# Patient Record
Sex: Female | Born: 1960 | Race: White | Hispanic: No | Smoking: Never smoker
Health system: Southern US, Community
[De-identification: ages and names within clinical notes are randomized; demographics above are authoritative.]

## PROBLEM LIST (undated history)

## (undated) DIAGNOSIS — E039 Hypothyroidism, unspecified: Secondary | ICD-10-CM

## (undated) DIAGNOSIS — E063 Autoimmune thyroiditis: Secondary | ICD-10-CM

---

## 2015-10-10 ENCOUNTER — Other Ambulatory Visit: Payer: Self-pay | Admitting: Pain Medicine

## 2015-10-10 DIAGNOSIS — R52 Pain, unspecified: Secondary | ICD-10-CM

## 2015-10-28 ENCOUNTER — Ambulatory Visit
Admission: RE | Admit: 2015-10-28 | Discharge: 2015-10-28 | Disposition: A | Discharge status: Home / Self Care | Payer: BLUE CROSS/BLUE SHIELD | Source: Ambulatory Visit | Attending: Pain Medicine | Admitting: Pain Medicine

## 2015-10-28 DIAGNOSIS — R52 Pain, unspecified: Secondary | ICD-10-CM

## 2018-12-15 ENCOUNTER — Other Ambulatory Visit: Payer: Self-pay | Admitting: Internal Medicine

## 2018-12-15 DIAGNOSIS — N6489 Other specified disorders of breast: Secondary | ICD-10-CM

## 2018-12-23 ENCOUNTER — Ambulatory Visit
Admission: RE | Admit: 2018-12-23 | Discharge: 2018-12-23 | Disposition: A | Discharge status: Home / Self Care | Payer: No Typology Code available for payment source | Source: Ambulatory Visit | Attending: Internal Medicine | Admitting: Internal Medicine

## 2018-12-23 DIAGNOSIS — N6489 Other specified disorders of breast: Secondary | ICD-10-CM

## 2019-05-23 ENCOUNTER — Other Ambulatory Visit: Payer: Self-pay | Admitting: Internal Medicine

## 2019-05-23 DIAGNOSIS — R928 Other abnormal and inconclusive findings on diagnostic imaging of breast: Secondary | ICD-10-CM

## 2019-06-06 ENCOUNTER — Other Ambulatory Visit: Payer: Self-pay

## 2019-06-06 ENCOUNTER — Ambulatory Visit
Admission: RE | Admit: 2019-06-06 | Discharge: 2019-06-06 | Disposition: A | Discharge status: Home / Self Care | Payer: No Typology Code available for payment source | Source: Ambulatory Visit | Attending: Internal Medicine | Admitting: Internal Medicine

## 2019-06-06 ENCOUNTER — Ambulatory Visit: Admission: RE | Admit: 2019-06-06 | Payer: No Typology Code available for payment source | Source: Ambulatory Visit

## 2019-06-06 DIAGNOSIS — R928 Other abnormal and inconclusive findings on diagnostic imaging of breast: Secondary | ICD-10-CM

## 2020-01-04 ENCOUNTER — Other Ambulatory Visit: Payer: Self-pay | Admitting: Family Medicine

## 2020-01-04 DIAGNOSIS — Z1231 Encounter for screening mammogram for malignant neoplasm of breast: Secondary | ICD-10-CM

## 2020-03-01 ENCOUNTER — Ambulatory Visit
Admission: RE | Admit: 2020-03-01 | Discharge: 2020-03-01 | Disposition: A | Discharge status: Home / Self Care | Payer: No Typology Code available for payment source | Source: Ambulatory Visit | Attending: Family Medicine | Admitting: Family Medicine

## 2020-03-01 ENCOUNTER — Other Ambulatory Visit: Payer: Self-pay

## 2020-03-01 DIAGNOSIS — Z1231 Encounter for screening mammogram for malignant neoplasm of breast: Secondary | ICD-10-CM

## 2021-01-29 ENCOUNTER — Other Ambulatory Visit: Payer: Self-pay | Admitting: Family Medicine

## 2021-01-29 DIAGNOSIS — Z1231 Encounter for screening mammogram for malignant neoplasm of breast: Secondary | ICD-10-CM

## 2021-03-21 ENCOUNTER — Ambulatory Visit
Admission: RE | Admit: 2021-03-21 | Discharge: 2021-03-21 | Disposition: A | Discharge status: Home / Self Care | Payer: No Typology Code available for payment source | Source: Ambulatory Visit | Attending: Family Medicine | Admitting: Family Medicine

## 2021-03-21 ENCOUNTER — Other Ambulatory Visit: Payer: Self-pay

## 2021-03-21 DIAGNOSIS — Z1231 Encounter for screening mammogram for malignant neoplasm of breast: Secondary | ICD-10-CM

## 2021-03-25 ENCOUNTER — Other Ambulatory Visit: Payer: Self-pay | Admitting: Family Medicine

## 2021-03-25 DIAGNOSIS — R928 Other abnormal and inconclusive findings on diagnostic imaging of breast: Secondary | ICD-10-CM

## 2021-03-28 ENCOUNTER — Other Ambulatory Visit: Payer: Self-pay

## 2021-03-28 ENCOUNTER — Ambulatory Visit
Admission: RE | Admit: 2021-03-28 | Discharge: 2021-03-28 | Disposition: A | Discharge status: Home / Self Care | Payer: No Typology Code available for payment source | Source: Ambulatory Visit | Attending: Family Medicine | Admitting: Family Medicine

## 2021-03-28 DIAGNOSIS — R928 Other abnormal and inconclusive findings on diagnostic imaging of breast: Secondary | ICD-10-CM

## 2021-04-21 ENCOUNTER — Ambulatory Visit: Payer: Self-pay | Admitting: Surgery

## 2021-04-21 DIAGNOSIS — N6310 Unspecified lump in the right breast, unspecified quadrant: Secondary | ICD-10-CM

## 2021-04-21 NOTE — H&P (Signed)
History of Present Illness Sarah Greer. Tregan Read MD; 04/21/2021 11:14 AM) The patient is a 60 year old female who presents with a breast mass. Referred by Dr. Modena Nunnery for right breast mass PCP Dr. Arlyss Repress  This is a 60 year old female who is status post a right breast biopsy in 2020 for an area of distortion in the upper outer quadrant. The biopsy at that time was benign. She had a mammogram earlier this year that showed a more pronounced and focal area of distortion. Radiology discussed with the patient and recommended either biopsy or excision. The patient has opted for excision. She presents now to discuss surgery.  No family history of breast cancer. No previous breast surgery.    Problem List/Past Medical Rodman Key K. Cleveland Paiz, MD; 04/21/2021 11:15 AM) MASS OF RIGHT BREAST ON MAMMOGRAM (N63.10)  Past Surgical History Mammie Lorenzo, LPN; 01/26/2541 70:62 AM) Breast Biopsy Right. Colon Polyp Removal - Colonoscopy  Diagnostic Studies History Mammie Lorenzo, LPN; 3/76/2831 51:76 AM) Colonoscopy 1-5 years ago Mammogram within last year Pap Smear 1-5 years ago  Allergies Mammie Lorenzo, LPN; 1/60/7371 06:26 AM) Codeine/Codeine Derivatives HYDROcodone-Acetaminophen *ANALGESICS - OPIOID* Allergies Reconciled  Medication History Mammie Lorenzo, LPN; 9/48/5462 70:35 AM) Tirosint (88MCG Capsule, Oral) Active. Vitamin B Complex (Oral) Active. Vitamin C (500MG  Capsule, Oral) Active. Medications Reconciled  Social History Mammie Lorenzo, LPN; 0/08/3817 29:93 AM) Alcohol use Occasional alcohol use. Caffeine use Coffee. No drug use Tobacco use Never smoker.  Family History Mammie Lorenzo, LPN; 06/21/9677 93:81 AM) Cervical Cancer Mother. Depression Daughter, Father, Son. Heart Disease Mother. Hypertension Father, Mother, Sister. Thyroid problems Mother.  Pregnancy / Birth History Mammie Lorenzo, LPN; 0/17/5102 58:52 AM) Age at menarche 32 years. Age  of menopause 54-50 Gravida 3 Length (months) of breastfeeding 7-12 Maternal age 59-30 Para 3  Other Problems Sarah Greer. Kniyah Khun, MD; 04/21/2021 11:15 AM) Depression Thyroid Disease     Review of Systems [name] LPN; 7/78/2423 53:61 AM) General Not Present- Appetite Loss, Chills, Fatigue, Fever, Night Sweats, Weight Gain and Weight Loss. Skin Not Present- Change in Wart/Mole, Dryness, Hives, Jaundice, New Lesions, Non-Healing Wounds, Rash and Ulcer. HEENT Present- Wears glasses/contact lenses. Not Present- Earache, Hearing Loss, Hoarseness, Nose Bleed, Oral Ulcers, Ringing in the Ears, Seasonal Allergies, Sinus Pain, Sore Throat, Visual Disturbances and Yellow Eyes. Respiratory Not Present- Bloody sputum, Chronic Cough, Difficulty Breathing, Snoring and Wheezing. Breast Present- Breast Pain. Not Present- Breast Mass, Nipple Discharge and Skin Changes. Cardiovascular Not Present- Chest Pain, Difficulty Breathing Lying Down, Leg Cramps, Palpitations, Rapid Heart Rate, Shortness of Breath and Swelling of Extremities. Gastrointestinal Not Present- Abdominal Pain, Bloating, Bloody Stool, Change in Bowel Habits, Chronic diarrhea, Constipation, Difficulty Swallowing, Excessive gas, Gets full quickly at meals, Hemorrhoids, Indigestion, Nausea, Rectal Pain and Vomiting. Female Genitourinary Not Present- Frequency, Nocturia, Painful Urination, Pelvic Pain and Urgency. Musculoskeletal Not Present- Back Pain, Joint Pain, Joint Stiffness, Muscle Pain, Muscle Weakness and Swelling of Extremities. Neurological Not Present- Decreased Memory, Fainting, Headaches, Numbness, Seizures, Tingling, Tremor, Trouble walking and Weakness. Psychiatric Not Present- Anxiety, Bipolar, Change in Sleep Pattern, Depression, Fearful and Frequent crying. Endocrine Not Present- Cold Intolerance, Excessive Hunger, Hair Changes, Heat Intolerance, Hot flashes and New Diabetes. Hematology Not Present- Blood Thinners,  Easy Bruising, Excessive bleeding, Gland problems, HIV and Persistent Infections.  Vitals Claiborne Billings Dockery LPN; 4/43/1540 08:67 AM) 04/21/2021 10:50 AM Weight: 173.4 lb Height: 66in Body Surface Area: 1.88 m Body Mass Index: 27.99 kg/m  Pulse: 74 (Regular)  BP: 120/74(Sitting, Left Arm, Standard)  Physical Exam Rodman Key K. Jojo Pehl MD; 04/21/2021 11:15 AM)  The physical exam findings are as follows: Note:Constitutional: WDWN in NAD, conversant, no obvious deformities; resting comfortably Eyes: Pupils equal, round; sclera anicteric; moist conjunctiva; no lid lag HENT: Oral mucosa moist; good dentition Neck: No masses palpated, trachea midline; no thyromegaly Lungs: CTA bilaterally; normal respiratory effort Breasts: Symmetric, no nipple changes, no nipple discharge, bilateral fibrocystic changes, no axillary lymphadenopathy, no dominant masses on either side. CV: Regular rate and rhythm; no murmurs; extremities well-perfused with no edema Abd: +bowel sounds, soft, non-tender, no palpable organomegaly; no palpable hernias Musc: Normal gait; no apparent clubbing or cyanosis in extremities Lymphatic: No palpable cervical or axillary lymphadenopathy Skin: Warm, dry; no sign of jaundice Psychiatric - alert and oriented x 4; calm mood and affect    Assessment & Plan Rodman Key K. Arantxa Piercey MD; 04/21/2021 11:12 AM)  MASS OF RIGHT BREAST ON MAMMOGRAM (N63.10)  Current Plans Schedule for Surgery - Right radioactive seed localized lumpectomy. The surgical procedure has been discussed with the patient. Potential risks, benefits, alternative treatments, and expected outcomes have been explained. All of the patient's questions at this time have been answered. The likelihood of reaching the patient's treatment goal is good. The patient understand the proposed surgical procedure and wishes to proceed.  Sarah Greer. Georgette Dover, MD, [facility]  General/ Trauma  Surgery   04/21/2021 11:16 AM

## 2021-04-21 NOTE — H&P (View-Only) (Signed)
History of Present Illness Sarah Greer. Sarah Radu MD; 04/21/2021 11:14 AM) The patient is a 60 year old female who presents with a breast mass. Referred by Dr. Modena Nunnery for right breast mass PCP Dr. Arlyss Greer  This is a 60 year old female who is status post a right breast biopsy in 2020 for an area of distortion in the upper outer quadrant. The biopsy at that time was benign. She had a mammogram earlier this year that showed a more pronounced and focal area of distortion. Radiology discussed with the patient and recommended either biopsy or excision. The patient has opted for excision. She presents now to discuss surgery.  No family history of breast cancer. No previous breast surgery.    Problem List/Past Medical Sarah Greer K. Cem Kosman, MD; 04/21/2021 11:15 AM) MASS OF RIGHT BREAST ON MAMMOGRAM (N63.10)  Past Surgical History Sarah Greer; 01/26/2541 70:62 AM) Breast Biopsy Right. Colon Polyp Removal - Colonoscopy  Diagnostic Studies History Sarah Greer; 3/76/2831 51:76 AM) Colonoscopy 1-5 years ago Mammogram within last year Pap Smear 1-5 years ago  Allergies Sarah Greer; 1/60/7371 06:26 AM) Codeine/Codeine Derivatives HYDROcodone-Acetaminophen *ANALGESICS - OPIOID* Allergies Reconciled  Medication History Sarah Greer; 9/48/5462 70:35 AM) Tirosint (88MCG Capsule, Oral) Active. Vitamin B Complex (Oral) Active. Vitamin C (500MG  Capsule, Oral) Active. Medications Reconciled  Social History Sarah Greer; 0/08/3817 29:93 AM) Alcohol use Occasional alcohol use. Caffeine use Coffee. No drug use Tobacco use Never smoker.  Family History Sarah Greer; 06/21/9677 93:81 AM) Cervical Cancer Mother. Depression Daughter, Father, Son. Heart Disease Mother. Hypertension Father, Mother, Sister. Thyroid problems Mother.  Pregnancy / Birth History Sarah Greer; 0/17/5102 58:52 AM) Age at menarche 32 years. Age  of menopause 54-50 Gravida 3 Length (months) of breastfeeding 7-12 Maternal age 59-30 Para 3  Other Problems Sarah Greer. Sarah Lezotte, MD; 04/21/2021 11:15 AM) Depression Thyroid Disease     Review of Systems Sarah Greer; 7/78/2423 53:61 AM) General Not Present- Appetite Loss, Chills, Fatigue, Fever, Night Sweats, Weight Gain and Weight Loss. Skin Not Present- Change in Wart/Mole, Dryness, Hives, Jaundice, New Lesions, Non-Healing Wounds, Rash and Ulcer. HEENT Present- Wears glasses/contact lenses. Not Present- Earache, Hearing Loss, Hoarseness, Nose Bleed, Oral Ulcers, Ringing in the Ears, Seasonal Allergies, Sinus Pain, Sore Throat, Visual Disturbances and Yellow Eyes. Respiratory Not Present- Bloody sputum, Chronic Cough, Difficulty Breathing, Snoring and Wheezing. Breast Present- Breast Pain. Not Present- Breast Mass, Nipple Discharge and Skin Changes. Cardiovascular Not Present- Chest Pain, Difficulty Breathing Lying Down, Leg Cramps, Palpitations, Rapid Heart Rate, Shortness of Breath and Swelling of Extremities. Gastrointestinal Not Present- Abdominal Pain, Bloating, Bloody Stool, Change in Bowel Habits, Chronic diarrhea, Constipation, Difficulty Swallowing, Excessive gas, Gets full quickly at meals, Hemorrhoids, Indigestion, Nausea, Rectal Pain and Vomiting. Female Genitourinary Not Present- Frequency, Nocturia, Painful Urination, Pelvic Pain and Urgency. Musculoskeletal Not Present- Back Pain, Joint Pain, Joint Stiffness, Muscle Pain, Muscle Weakness and Swelling of Extremities. Neurological Not Present- Decreased Memory, Fainting, Headaches, Numbness, Seizures, Tingling, Tremor, Trouble walking and Weakness. Psychiatric Not Present- Anxiety, Bipolar, Change in Sleep Pattern, Depression, Fearful and Frequent crying. Endocrine Not Present- Cold Intolerance, Excessive Hunger, Hair Changes, Heat Intolerance, Hot flashes and New Diabetes. Hematology Not Present- Blood Thinners,  Easy Bruising, Excessive bleeding, Gland problems, HIV and Persistent Infections.  Vitals Sarah Billings Dockery Greer; 4/43/1540 08:67 AM) 04/21/2021 10:50 AM Weight: 173.4 lb Height: 66in Body Surface Area: 1.88 m Body Mass Index: 27.99 kg/m  Pulse: 74 (Regular)  BP: 120/74(Sitting, Left Arm, Standard)  Physical Exam Sarah Greer K. Mathews Stuhr MD; 04/21/2021 11:15 AM)  The physical exam findings are as follows: Note:Constitutional: WDWN in NAD, conversant, no obvious deformities; resting comfortably Eyes: Pupils equal, round; sclera anicteric; moist conjunctiva; no lid lag HENT: Oral mucosa moist; good dentition Neck: No masses palpated, trachea midline; no thyromegaly Lungs: CTA bilaterally; normal respiratory effort Breasts: Symmetric, no nipple changes, no nipple discharge, bilateral fibrocystic changes, no axillary lymphadenopathy, no dominant masses on either side. CV: Regular rate and rhythm; no murmurs; extremities well-perfused with no edema Abd: +bowel sounds, soft, non-tender, no palpable organomegaly; no palpable hernias Musc: Normal gait; no apparent clubbing or cyanosis in extremities Lymphatic: No palpable cervical or axillary lymphadenopathy Skin: Warm, dry; no sign of jaundice Psychiatric - alert and oriented x 4; calm mood and affect    Assessment & Plan Sarah Greer K. Cecil Vandyke MD; 04/21/2021 11:12 AM)  MASS OF RIGHT BREAST ON MAMMOGRAM (N63.10)  Current Plans Schedule for Surgery - Right radioactive seed localized lumpectomy. The surgical procedure has been discussed with the patient. Potential risks, benefits, alternative treatments, and expected outcomes have been explained. All of the patient's questions at this time have been answered. The likelihood of reaching the patient's treatment goal is good. The patient understand the proposed surgical procedure and wishes to proceed.  Sarah Greer. Sarah Dover, MD, [facility]  General/ Trauma  Surgery   04/21/2021 11:16 AM

## 2021-04-22 ENCOUNTER — Other Ambulatory Visit: Payer: Self-pay | Admitting: Surgery

## 2021-04-22 DIAGNOSIS — N6310 Unspecified lump in the right breast, unspecified quadrant: Secondary | ICD-10-CM

## 2021-04-28 ENCOUNTER — Other Ambulatory Visit: Payer: Self-pay

## 2021-04-28 ENCOUNTER — Encounter (HOSPITAL_BASED_OUTPATIENT_CLINIC_OR_DEPARTMENT_OTHER): Payer: Self-pay | Admitting: Surgery

## 2021-04-30 NOTE — Progress Notes (Signed)

## 2021-05-06 ENCOUNTER — Ambulatory Visit
Admission: RE | Admit: 2021-05-06 | Discharge: 2021-05-06 | Disposition: A | Discharge status: Home / Self Care | Payer: No Typology Code available for payment source | Source: Ambulatory Visit | Attending: Surgery | Admitting: Surgery

## 2021-05-06 ENCOUNTER — Other Ambulatory Visit: Payer: Self-pay

## 2021-05-06 DIAGNOSIS — N6310 Unspecified lump in the right breast, unspecified quadrant: Secondary | ICD-10-CM

## 2021-05-08 ENCOUNTER — Encounter (HOSPITAL_BASED_OUTPATIENT_CLINIC_OR_DEPARTMENT_OTHER)
Admission: RE | Disposition: A | Discharge status: Home / Self Care | Payer: Self-pay | Source: Home / Self Care | Attending: Surgery

## 2021-05-08 ENCOUNTER — Other Ambulatory Visit: Payer: Self-pay

## 2021-05-08 ENCOUNTER — Ambulatory Visit
Admission: RE | Admit: 2021-05-08 | Discharge: 2021-05-08 | Disposition: A | Discharge status: Home / Self Care | Payer: No Typology Code available for payment source | Source: Ambulatory Visit | Attending: Surgery | Admitting: Surgery

## 2021-05-08 ENCOUNTER — Encounter (HOSPITAL_BASED_OUTPATIENT_CLINIC_OR_DEPARTMENT_OTHER): Payer: Self-pay | Admitting: Surgery

## 2021-05-08 ENCOUNTER — Ambulatory Visit (HOSPITAL_BASED_OUTPATIENT_CLINIC_OR_DEPARTMENT_OTHER)
Admission: RE | Admit: 2021-05-08 | Discharge: 2021-05-08 | Disposition: A | Discharge status: Home / Self Care | Payer: No Typology Code available for payment source | Attending: Surgery | Admitting: Surgery

## 2021-05-08 ENCOUNTER — Ambulatory Visit (HOSPITAL_BASED_OUTPATIENT_CLINIC_OR_DEPARTMENT_OTHER): Payer: No Typology Code available for payment source | Admitting: Anesthesiology

## 2021-05-08 DIAGNOSIS — Z8249 Family history of ischemic heart disease and other diseases of the circulatory system: Secondary | ICD-10-CM | POA: Insufficient documentation

## 2021-05-08 DIAGNOSIS — R928 Other abnormal and inconclusive findings on diagnostic imaging of breast: Secondary | ICD-10-CM | POA: Insufficient documentation

## 2021-05-08 DIAGNOSIS — Z86010 Personal history of colonic polyps: Secondary | ICD-10-CM | POA: Insufficient documentation

## 2021-05-08 DIAGNOSIS — N6021 Fibroadenosis of right breast: Secondary | ICD-10-CM | POA: Insufficient documentation

## 2021-05-08 DIAGNOSIS — Z8049 Family history of malignant neoplasm of other genital organs: Secondary | ICD-10-CM | POA: Insufficient documentation

## 2021-05-08 DIAGNOSIS — Z885 Allergy status to narcotic agent status: Secondary | ICD-10-CM | POA: Insufficient documentation

## 2021-05-08 DIAGNOSIS — D241 Benign neoplasm of right breast: Secondary | ICD-10-CM | POA: Insufficient documentation

## 2021-05-08 DIAGNOSIS — N6310 Unspecified lump in the right breast, unspecified quadrant: Secondary | ICD-10-CM

## 2021-05-08 HISTORY — DX: Hypothyroidism, unspecified: E03.9

## 2021-05-08 HISTORY — DX: Autoimmune thyroiditis: E06.3

## 2021-05-08 HISTORY — PX: BREAST LUMPECTOMY WITH RADIOACTIVE SEED LOCALIZATION: SHX6424

## 2021-05-08 SURGERY — BREAST LUMPECTOMY WITH RADIOACTIVE SEED LOCALIZATION
Anesthesia: General | Site: Breast | Laterality: Right

## 2021-05-08 MED ORDER — DEXAMETHASONE SODIUM PHOSPHATE 10 MG/ML IJ SOLN
INTRAMUSCULAR | Status: AC
  Filled 2021-05-08: qty 1

## 2021-05-08 MED ORDER — PROPOFOL 10 MG/ML IV BOLUS
INTRAVENOUS | Status: AC
  Filled 2021-05-08: qty 20

## 2021-05-08 MED ORDER — MIDAZOLAM HCL 5 MG/5ML IJ SOLN
INTRAMUSCULAR | Status: DC | PRN
  Administered 2021-05-08: 2 mg via INTRAVENOUS

## 2021-05-08 MED ORDER — FENTANYL CITRATE (PF) 100 MCG/2ML IJ SOLN
INTRAMUSCULAR | Status: DC | PRN
  Administered 2021-05-08 (×2): 50 ug via INTRAVENOUS

## 2021-05-08 MED ORDER — CEFAZOLIN SODIUM-DEXTROSE 2-4 GM/100ML-% IV SOLN
2.0000 g | INTRAVENOUS | Status: AC
  Administered 2021-05-08: 2 g via INTRAVENOUS

## 2021-05-08 MED ORDER — EPHEDRINE SULFATE 50 MG/ML IJ SOLN
INTRAMUSCULAR | Status: DC | PRN
  Administered 2021-05-08 (×2): 10 mg via INTRAVENOUS

## 2021-05-08 MED ORDER — BUPIVACAINE-EPINEPHRINE 0.25% -1:200000 IJ SOLN
INTRAMUSCULAR | Status: DC | PRN
  Administered 2021-05-08: 10 mL

## 2021-05-08 MED ORDER — MIDAZOLAM HCL 2 MG/2ML IJ SOLN
INTRAMUSCULAR | Status: AC
  Filled 2021-05-08: qty 2

## 2021-05-08 MED ORDER — OXYCODONE HCL 5 MG PO TABS: 5.0000 mg | ORAL_TABLET | Freq: Once | ORAL | Status: DC | PRN

## 2021-05-08 MED ORDER — LACTATED RINGERS IV SOLN: INTRAVENOUS | Status: DC

## 2021-05-08 MED ORDER — MEPERIDINE HCL 25 MG/ML IJ SOLN: 6.2500 mg | INTRAMUSCULAR | Status: DC | PRN

## 2021-05-08 MED ORDER — PROPOFOL 10 MG/ML IV BOLUS
INTRAVENOUS | Status: DC | PRN
  Administered 2021-05-08: 200 mg via INTRAVENOUS

## 2021-05-08 MED ORDER — PROMETHAZINE HCL 25 MG/ML IJ SOLN
6.2500 mg | INTRAMUSCULAR | Status: DC | PRN
Start: 2021-05-08 — End: 2021-05-08

## 2021-05-08 MED ORDER — ACETAMINOPHEN 500 MG PO TABS
1000.0000 mg | ORAL_TABLET | ORAL | Status: AC
  Administered 2021-05-08: 1000 mg via ORAL

## 2021-05-08 MED ORDER — HYDROMORPHONE HCL 1 MG/ML IJ SOLN: 0.2500 mg | INTRAMUSCULAR | Status: DC | PRN

## 2021-05-08 MED ORDER — FENTANYL CITRATE (PF) 100 MCG/2ML IJ SOLN
INTRAMUSCULAR | Status: AC
  Filled 2021-05-08: qty 2

## 2021-05-08 MED ORDER — AMISULPRIDE (ANTIEMETIC) 5 MG/2ML IV SOLN: 10.0000 mg | Freq: Once | INTRAVENOUS | Status: DC | PRN

## 2021-05-08 MED ORDER — CEFAZOLIN SODIUM-DEXTROSE 2-4 GM/100ML-% IV SOLN
INTRAVENOUS | Status: AC
  Filled 2021-05-08: qty 100

## 2021-05-08 MED ORDER — LIDOCAINE HCL (CARDIAC) PF 100 MG/5ML IV SOSY
PREFILLED_SYRINGE | INTRAVENOUS | Status: DC | PRN
  Administered 2021-05-08: 60 mg via INTRAVENOUS

## 2021-05-08 MED ORDER — DEXAMETHASONE SODIUM PHOSPHATE 4 MG/ML IJ SOLN
INTRAMUSCULAR | Status: DC | PRN
  Administered 2021-05-08: 5 mg via INTRAVENOUS

## 2021-05-08 MED ORDER — ONDANSETRON HCL 4 MG/2ML IJ SOLN
INTRAMUSCULAR | Status: AC
  Filled 2021-05-08: qty 2

## 2021-05-08 MED ORDER — 0.9 % SODIUM CHLORIDE (POUR BTL) OPTIME
TOPICAL | Status: DC | PRN
  Administered 2021-05-08: 1000 mL

## 2021-05-08 MED ORDER — ACETAMINOPHEN 500 MG PO TABS
ORAL_TABLET | ORAL | Status: AC
  Filled 2021-05-08: qty 2

## 2021-05-08 MED ORDER — OXYCODONE HCL 5 MG/5ML PO SOLN: 5.0000 mg | Freq: Once | ORAL | Status: DC | PRN

## 2021-05-08 MED ORDER — CHLORHEXIDINE GLUCONATE CLOTH 2 % EX PADS: 6.0000 | MEDICATED_PAD | Freq: Once | CUTANEOUS | Status: DC

## 2021-05-08 SURGICAL SUPPLY — 49 items
APL PRP STRL LF DISP 70% ISPRP (MISCELLANEOUS) ×1
APL SKNCLS STERI-STRIP NONHPOA (GAUZE/BANDAGES/DRESSINGS) ×1
APPLIER CLIP 9.375 MED OPEN (MISCELLANEOUS) ×2
APR CLP MED 9.3 20 MLT OPN (MISCELLANEOUS) ×1
BENZOIN TINCTURE PRP APPL 2/3 (GAUZE/BANDAGES/DRESSINGS) ×2 IMPLANT
BLADE HEX COATED 2.75 (ELECTRODE) ×2 IMPLANT
BLADE SURG 15 STRL LF DISP TIS (BLADE) ×1 IMPLANT
BLADE SURG 15 STRL SS (BLADE) ×2
CANISTER SUC SOCK COL 7IN (MISCELLANEOUS) IMPLANT
CANISTER SUCT 1200ML W/VALVE (MISCELLANEOUS) IMPLANT
CHLORAPREP W/TINT 26 (MISCELLANEOUS) ×2 IMPLANT
CLIP APPLIE 9.375 MED OPEN (MISCELLANEOUS) ×1 IMPLANT
COVER BACK TABLE 60X90IN (DRAPES) ×2 IMPLANT
COVER MAYO STAND STRL (DRAPES) ×2 IMPLANT
COVER PROBE W GEL 5X96 (DRAPES) ×2 IMPLANT
COVER WAND RF STERILE (DRAPES) IMPLANT
DECANTER SPIKE VIAL GLASS SM (MISCELLANEOUS) IMPLANT
DRAPE LAPAROTOMY 100X72 PEDS (DRAPES) ×2 IMPLANT
DRAPE UTILITY XL STRL (DRAPES) ×2 IMPLANT
DRSG TEGADERM 4X4.75 (GAUZE/BANDAGES/DRESSINGS) ×2 IMPLANT
ELECT REM PT RETURN 9FT ADLT (ELECTROSURGICAL) ×2
ELECTRODE REM PT RTRN 9FT ADLT (ELECTROSURGICAL) ×1 IMPLANT
GAUZE SPONGE 4X4 12PLY STRL LF (GAUZE/BANDAGES/DRESSINGS) IMPLANT
GLOVE SURG ENC MOIS LTX SZ7 (GLOVE) ×2 IMPLANT
GLOVE SURG UNDER POLY LF SZ7.5 (GLOVE) ×2 IMPLANT
GOWN STRL REUS W/ TWL LRG LVL3 (GOWN DISPOSABLE) ×2 IMPLANT
GOWN STRL REUS W/TWL LRG LVL3 (GOWN DISPOSABLE) ×4
ILLUMINATOR WAVEGUIDE N/F (MISCELLANEOUS) IMPLANT
KIT MARKER MARGIN INK (KITS) ×2 IMPLANT
LIGHT WAVEGUIDE WIDE FLAT (MISCELLANEOUS) IMPLANT
NEEDLE HYPO 25X1 1.5 SAFETY (NEEDLE) ×2 IMPLANT
NS IRRIG 1000ML POUR BTL (IV SOLUTION) ×2 IMPLANT
PACK BASIN DAY SURGERY FS (CUSTOM PROCEDURE TRAY) ×2 IMPLANT
PENCIL SMOKE EVACUATOR (MISCELLANEOUS) ×2 IMPLANT
SLEEVE SCD COMPRESS KNEE MED (STOCKING) ×2 IMPLANT
SPONGE GAUZE 2X2 8PLY STRL LF (GAUZE/BANDAGES/DRESSINGS) IMPLANT
SPONGE LAP 18X18 RF (DISPOSABLE) IMPLANT
SPONGE LAP 4X18 RFD (DISPOSABLE) ×2 IMPLANT
STRIP CLOSURE SKIN 1/2X4 (GAUZE/BANDAGES/DRESSINGS) ×2 IMPLANT
SUT MON AB 4-0 PC3 18 (SUTURE) ×2 IMPLANT
SUT SILK 2 0 SH (SUTURE) IMPLANT
SUT VIC AB 3-0 SH 27 (SUTURE) ×2
SUT VIC AB 3-0 SH 27X BRD (SUTURE) ×1 IMPLANT
SYR BULB EAR ULCER 3OZ GRN STR (SYRINGE) IMPLANT
SYR CONTROL 10ML LL (SYRINGE) ×2 IMPLANT
TOWEL GREEN STERILE FF (TOWEL DISPOSABLE) ×2 IMPLANT
TRAY FAXITRON CT DISP (TRAY / TRAY PROCEDURE) ×2 IMPLANT
TUBE CONNECTING 20X1/4 (TUBING) IMPLANT
YANKAUER SUCT BULB TIP NO VENT (SUCTIONS) IMPLANT

## 2021-05-08 NOTE — Interval H&P Note (Signed)
History and Physical Interval Note:  05/08/2021 12:41 PM  Sarah Greer  has presented today for surgery, with the diagnosis of RIGHT BREAST MASS.  The various methods of treatment have been discussed with the patient and family. After consideration of risks, benefits and other options for treatment, the patient has consented to  Procedure(s): RIGHT BREAST LUMPECTOMY WITH RADIOACTIVE SEED LOCALIZATION (Right) as a surgical intervention.  The patient's history has been reviewed, patient examined, no change in status, stable for surgery.  I have reviewed the patient's chart and labs.  Questions were answered to the patient's satisfaction.     [name]

## 2021-05-08 NOTE — Discharge Instructions (Signed)
[place] Office Phone Number [phone]  BREAST BIOPSY/ PARTIAL MASTECTOMY: POST OP INSTRUCTIONS  Always review your discharge instruction sheet given to you by the facility where your surgery was performed.  IF YOU HAVE DISABILITY OR FAMILY LEAVE FORMS, YOU MUST BRING THEM TO THE OFFICE FOR PROCESSING.  DO NOT GIVE THEM TO YOUR DOCTOR.  1. A prescription for pain medication may be given to you upon discharge.  Take your pain medication as prescribed, if needed.  If narcotic pain medicine is not needed, then you may take acetaminophen (Tylenol) or ibuprofen (Advil) as needed. 2. Take your usually prescribed medications unless otherwise directed 3. If you need a refill on your pain medication, please contact your pharmacy.  They will contact our office to request authorization.  Prescriptions will not be filled after 5pm or on week-ends. 4. You should eat very light the first 24 hours after surgery, such as soup, crackers, pudding, etc.  Resume your normal diet the day after surgery. 5. Most patients will experience some swelling and bruising in the breast.  Ice packs and a good support bra will help.  Swelling and bruising can take several days to resolve.  6. It is common to experience some constipation if taking pain medication after surgery.  Increasing fluid intake and taking a stool softener will usually help or prevent this problem from occurring.  A mild laxative (Milk of Magnesia or Miralax) should be taken according to package directions if there are no bowel movements after 48 hours. 7. Unless discharge instructions indicate otherwise, you may remove your bandages 24-48 hours after surgery, and you may shower at that time.  You may have steri-strips (small skin tapes) in place directly over the incision.  These strips should be left on the skin for 7-10 days.  If your surgeon used skin glue on the incision, you may shower in 24 hours.  The glue will flake off over the  next 2-3 weeks.  Any sutures or staples will be removed at the office during your follow-up visit. 8. ACTIVITIES:  You may resume regular daily activities (gradually increasing) beginning the next day.  Wearing a good support bra or sports bra minimizes pain and swelling.  You may have sexual intercourse when it is comfortable. a. You may drive when you no longer are taking prescription pain medication, you can comfortably wear a seatbelt, and you can safely maneuver your car and apply brakes. b. RETURN TO WORK:  ______________________________________________________________________________________ 9. You should see your doctor in the office for a follow-up appointment approximately two weeks after your surgery.  Your doctor's nurse will typically make your follow-up appointment when she calls you with your pathology report.  Expect your pathology report 2-3 business days after your surgery.  You may call to check if you do not hear from [place] after three days. 10. OTHER INSTRUCTIONS: _______________________________________________________________________________________________ _____________________________________________________________________________________________________________________________________ _____________________________________________________________________________________________________________________________________ _____________________________________________________________________________________________________________________________________  WHEN TO CALL YOUR DOCTOR: 1. Fever over 101.0 2. Nausea and/or vomiting. 3. Extreme swelling or bruising. 4. Continued bleeding from incision. 5. Increased pain, redness, or drainage from the incision.  The clinic staff is available to answer your questions during regular business hours.  Please don't hesitate to call and ask to speak to one of the nurses for clinical concerns.  If you have a medical emergency, go to the nearest  emergency room or call 911.  A surgeon from [facility] is always on call at the hospital.  For further questions, please visit [email or web address]  May have Tyenol after 6pm, if needed.  Post Anesthesia Home Care Instructions  Activity: Get plenty of rest for the remainder of the day. A responsible individual must stay with you for 24 hours following the procedure.  For the next 24 hours, DO NOT: -Drive a car -Paediatric nurse -Drink alcoholic beverages -Take any medication unless instructed by your physician -Make any legal decisions or sign important papers.  Meals: Start with liquid foods such as gelatin or soup. Progress to regular foods as tolerated. Avoid greasy, spicy, heavy foods. If nausea and/or vomiting occur, drink only clear liquids until the nausea and/or vomiting subsides. Call your physician if vomiting continues.  Special Instructions/Symptoms: Your throat may feel dry or sore from the anesthesia or the breathing tube placed in your throat during surgery. If this causes discomfort, gargle with warm salt water. The discomfort should disappear within 24 hours.  If you had a scopolamine patch placed behind your ear for the management of post- operative nausea and/or vomiting:  1. The medication in the patch is effective for 72 hours, after which it should be removed.  Wrap patch in a tissue and discard in the trash. Wash hands thoroughly with soap and water. 2. You may remove the patch earlier than 72 hours if you experience unpleasant side effects which may include dry mouth, dizziness or visual disturbances. 3. Avoid touching the patch. Wash your hands with soap and water after contact with the patch.

## 2021-05-08 NOTE — Transfer of Care (Signed)
Immediate Anesthesia Transfer of Care Note  Patient: Sarah Greer  Procedure(s) Performed: RIGHT BREAST LUMPECTOMY WITH RADIOACTIVE SEED LOCALIZATION (Right Breast)  Patient Location: PACU  Anesthesia Type:General  Level of Consciousness: sedated  Airway & Oxygen Therapy: Patient Spontanous Breathing and Patient connected to face mask oxygen  Post-op Assessment: Report given to RN and Post -op Vital signs reviewed and stable  Post vital signs: Reviewed and stable  Last Vitals:  Vitals Value Taken Time  BP 96/48 05/08/21 1408  Temp 36.5 C 05/08/21 1408  Pulse 70 05/08/21 1409  Resp 14 05/08/21 1409  SpO2 99 % 05/08/21 1409  Vitals shown include unvalidated device data.  Last Pain:  Vitals:   05/08/21 1207  TempSrc: Oral  PainSc: 0-No pain      Patients Stated Pain Goal: 3 (20/03/79 4446)  Complications: No complications documented.

## 2021-05-08 NOTE — Anesthesia Procedure Notes (Signed)
Procedure Name: LMA Insertion Date/Time: 05/08/2021 1:29 PM Performed by: Maryella Shivers, CRNA Pre-anesthesia Checklist: Patient identified, Emergency Drugs available, Suction available and Patient being monitored Patient Re-evaluated:Patient Re-evaluated prior to induction Oxygen Delivery Method: Circle system utilized Preoxygenation: Pre-oxygenation with 100% oxygen Induction Type: IV induction Ventilation: Mask ventilation without difficulty LMA: LMA inserted LMA Size: 4.0 Number of attempts: 1 Airway Equipment and Method: Bite block Placement Confirmation: positive ETCO2 Tube secured with: Tape Dental Injury: Teeth and Oropharynx as per pre-operative assessment

## 2021-05-08 NOTE — Op Note (Signed)
Pre-op Diagnosis:  Right breast abnormal mammogram  Post-op Diagnosis: same Procedure:  Right radioactive seed localized lumpectomy Surgeon:  Julietta Batterman K. Anesthesia:  GEN - LMA Indications:  This is a 60 year old female who is status post a right breast biopsy in 2020 for an area of distortion in the upper outer quadrant. The biopsy at that time was benign. She had a mammogram earlier this year that showed a more pronounced and focal area of distortion. Radiology discussed with the patient and recommended either biopsy or excision. The patient has opted for excision.  Description of procedure: The patient is brought to the operating room placed in supine position on the operating room table. After an adequate level of general anesthesia was obtained, her right breast was prepped with ChloraPrep and draped in sterile fashion. A timeout was taken to ensure the proper patient and proper procedure. We interrogated the breast with the neoprobe. We made a transverse incision in the lateral right breast after infiltrating with 0.25% Marcaine. Dissection was carried down in the breast tissue with cautery. We used the neoprobe to guide [place] towards the radioactive seed. We excised an area of tissue around the radioactive seed 2 cm in diameter. The specimen was removed and was oriented with a paint kit. Specimen mammogram showed the radioactive seed as well as the biopsy clip within the specimen. This was sent for pathologic examination. There is no residual radioactivity within the biopsy cavity. We inspected carefully for hemostasis. The wound was thoroughly irrigated. The wound was closed with a deep layer of 3-0 Vicryl and a subcuticular layer of 4-0 Monocryl. Benzoin Steri-Strips were applied. The patient was then extubated and brought to the recovery room in stable condition. All sponge, instrument, and needle counts are correct.  Sarah Greer. Georgette Dover, MD, [facility]  General/ Trauma  Surgery  05/08/2021 2:05 PM

## 2021-05-08 NOTE — Anesthesia Postprocedure Evaluation (Signed)
Anesthesia Post Note  Patient: Sarah Greer [facility]  Procedure(s) Performed: RIGHT BREAST LUMPECTOMY WITH RADIOACTIVE SEED LOCALIZATION (Right Breast)     Patient location during evaluation: PACU Anesthesia Type: General Level of consciousness: awake and alert, oriented and patient cooperative Pain management: pain level controlled Vital Signs Assessment: post-procedure vital signs reviewed and stable Respiratory status: spontaneous breathing, nonlabored ventilation and respiratory function stable Cardiovascular status: blood pressure returned to baseline and stable Postop Assessment: no apparent nausea or vomiting Anesthetic complications: no   No complications documented.  Last Vitals:  Vitals:   05/08/21 1430 05/08/21 1445  BP: 116/65 119/65  Pulse: 67 67  Resp: 11 16  Temp:  36.5 C  SpO2: 99% 97%    Last Pain:  Vitals:   05/08/21 1445  TempSrc:   PainSc: 0-No pain                 Pervis Hocking

## 2021-05-08 NOTE — Anesthesia Preprocedure Evaluation (Signed)
Anesthesia Evaluation  Patient identified by MRN, date of birth, ID band Patient awake    Reviewed: Allergy & Precautions, NPO status , Patient's Chart, lab work & pertinent test results  Airway Mallampati: II  TM Distance: >3 FB Neck ROM: Full    Dental no notable dental hx.    Pulmonary neg pulmonary ROS,    Pulmonary exam normal breath sounds clear to auscultation       Cardiovascular negative cardio ROS Normal cardiovascular exam Rhythm:Regular Rate:Normal     Neuro/Psych negative neurological ROS  negative psych ROS   GI/Hepatic negative GI ROS, Neg liver ROS,   Endo/Other  Hypothyroidism   Renal/GU negative Renal ROS  negative genitourinary   Musculoskeletal negative musculoskeletal ROS (+)   Abdominal   Peds negative pediatric ROS (+)  Hematology negative hematology ROS (+)   Anesthesia Other Findings   Reproductive/Obstetrics negative OB ROS                             Anesthesia Physical Anesthesia Plan  ASA: II  Anesthesia Plan: General   Post-op Pain Management:    Induction: Intravenous  PONV Risk Score and Plan: 3 and Ondansetron, Dexamethasone, Midazolam and Treatment may vary due to age or medical condition  Airway Management Planned: LMA  Additional Equipment:   Intra-op Plan:   Post-operative Plan: Extubation in OR  Informed Consent: I have reviewed the patients History and Physical, chart, labs and discussed the procedure including the risks, benefits and alternatives for the proposed anesthesia with the patient or authorized representative who has indicated his/her understanding and acceptance.     Dental advisory given  Plan Discussed with: CRNA  Anesthesia Plan Comments:         Anesthesia Quick Evaluation

## 2021-05-09 ENCOUNTER — Encounter (HOSPITAL_BASED_OUTPATIENT_CLINIC_OR_DEPARTMENT_OTHER): Payer: Self-pay | Admitting: Surgery

## 2021-05-12 LAB — SURGICAL PATHOLOGY

## 2021-10-24 IMAGING — MG MM DIGITAL SCREENING BILAT W/ TOMO AND CAD
8 series · 8 of 24 positions shown · non-contrast
Comparison: Previous exam(s).

CLINICAL DATA: Screening.

EXAM:
DIGITAL SCREENING BILATERAL MAMMOGRAM WITH TOMOSYNTHESIS AND CAD
TECHNIQUE: Bilateral screening digital craniocaudal and mediolateral oblique
mammograms were obtained. Bilateral screening digital breast
tomosynthesis was performed. The images were evaluated with
computer-aided detection.

[R MLO synth-2D]
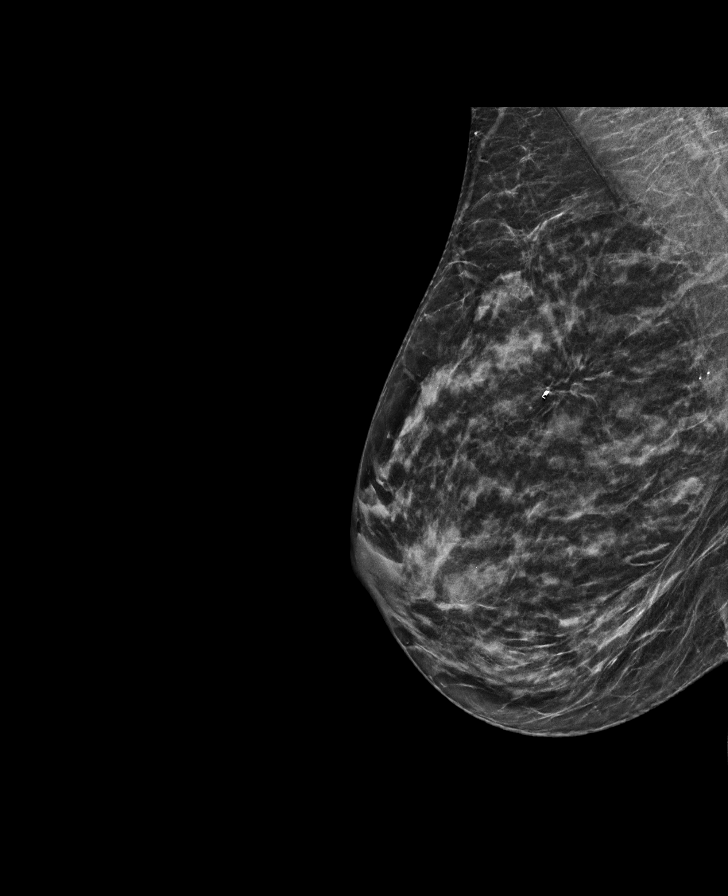

[L MLO synth-2D]
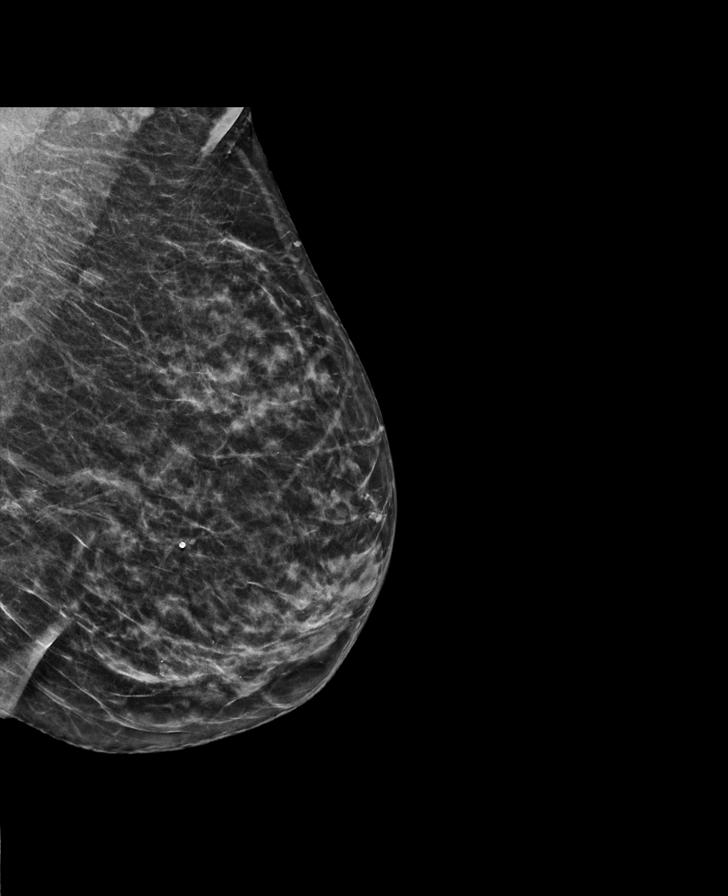

[L CC synth-2D]
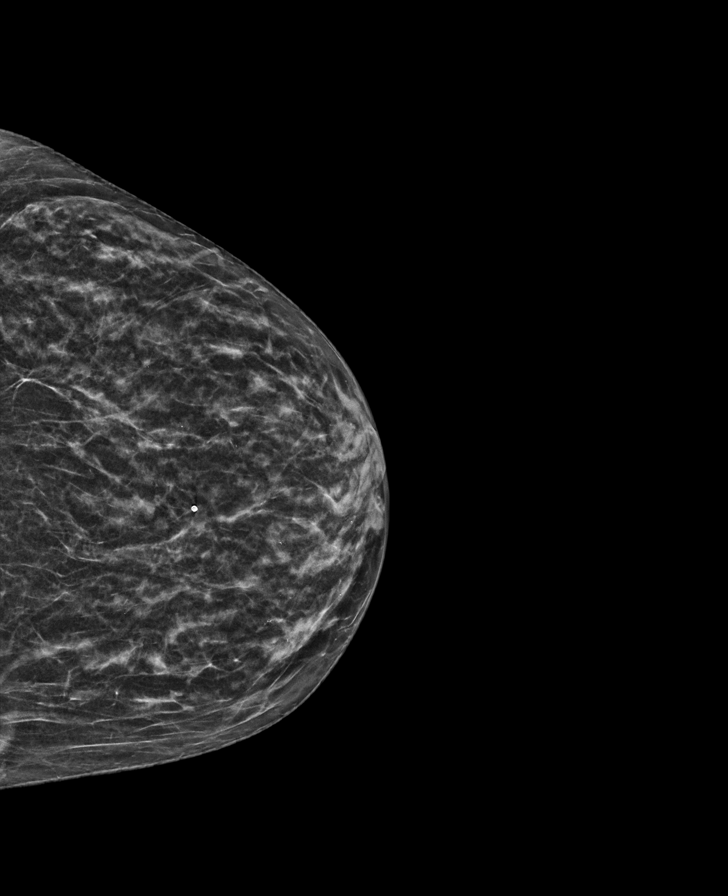

[R CC synth-2D]
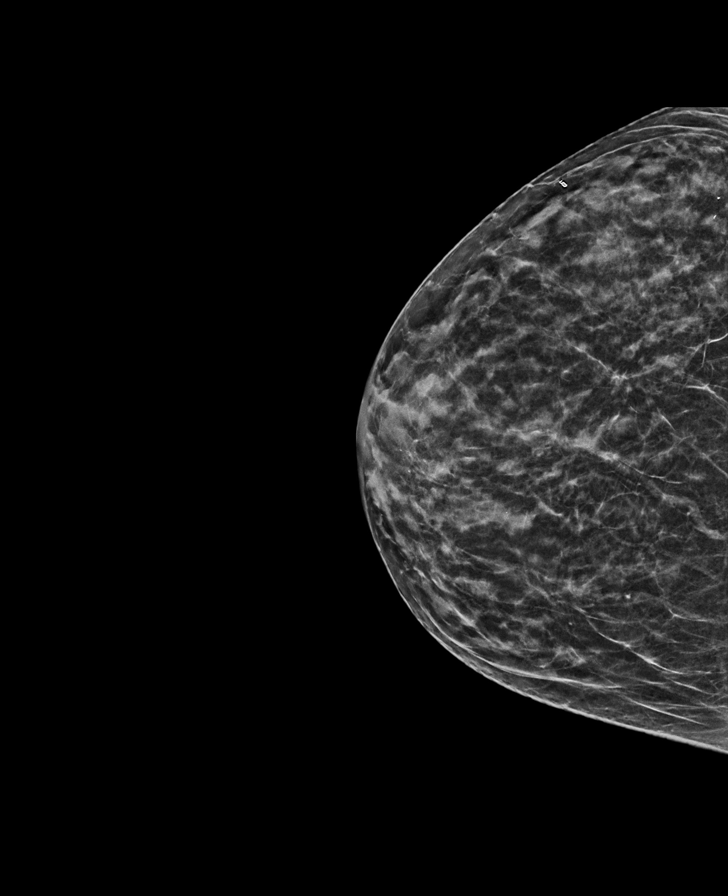

[R CC tomo · tomo slice 23/46.0]
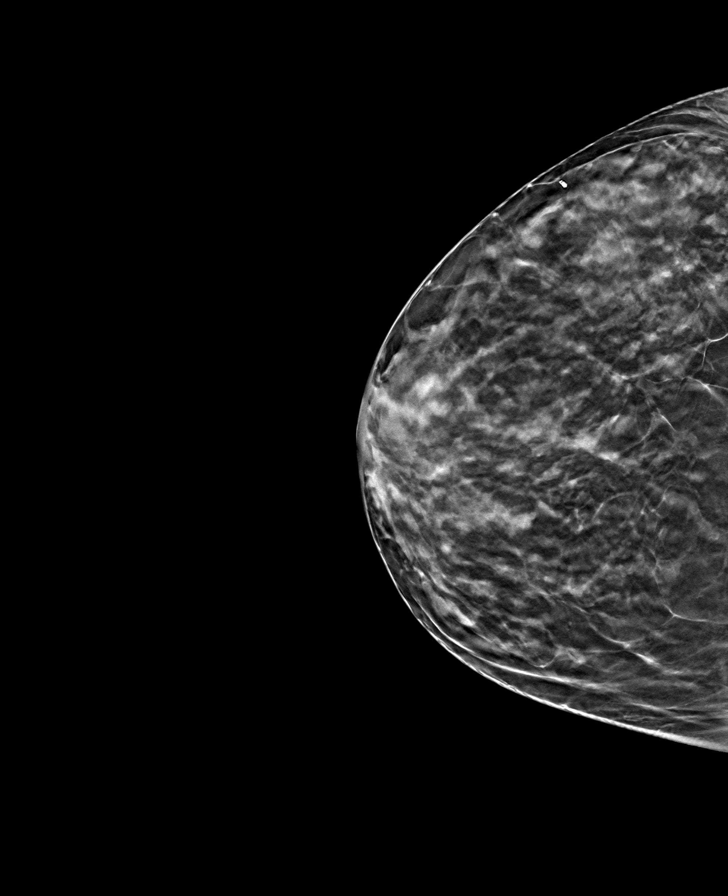

[R MLO tomo · tomo slice 27/54.0]
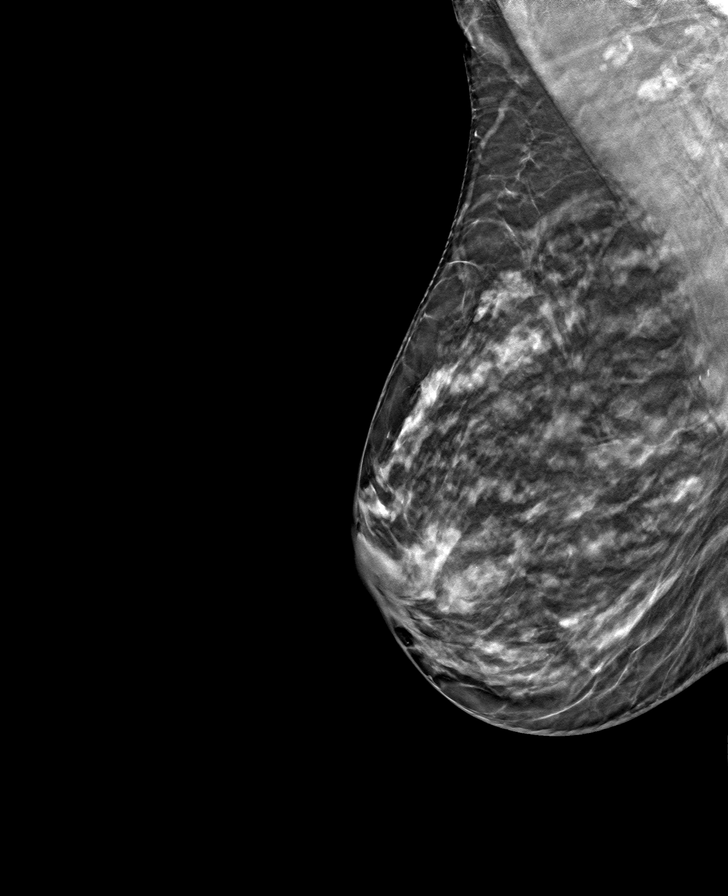

[L CC tomo · tomo slice 23/45.0]
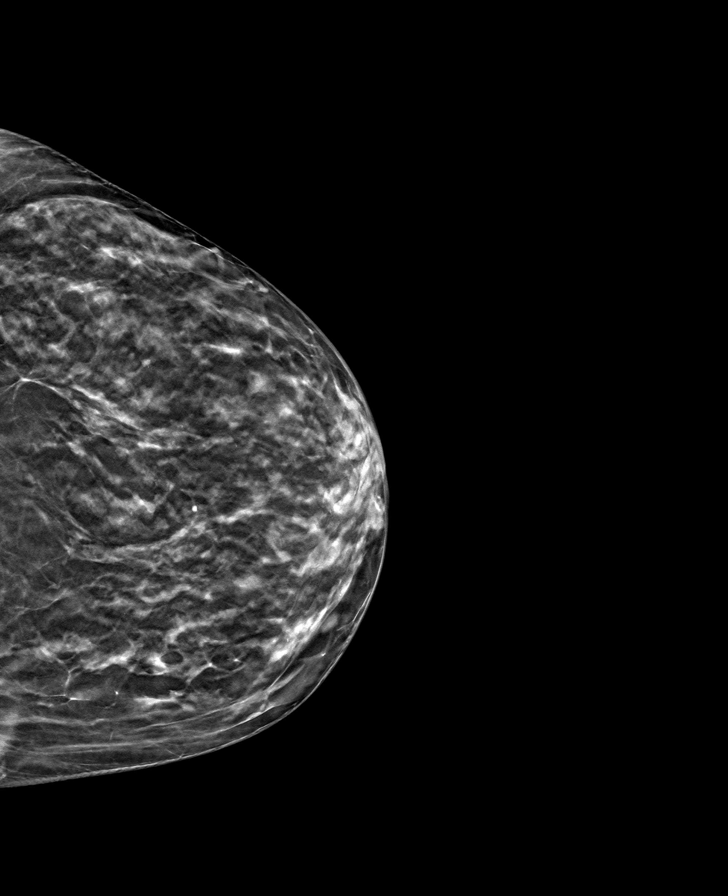

[L MLO tomo · tomo slice 27/54.0]
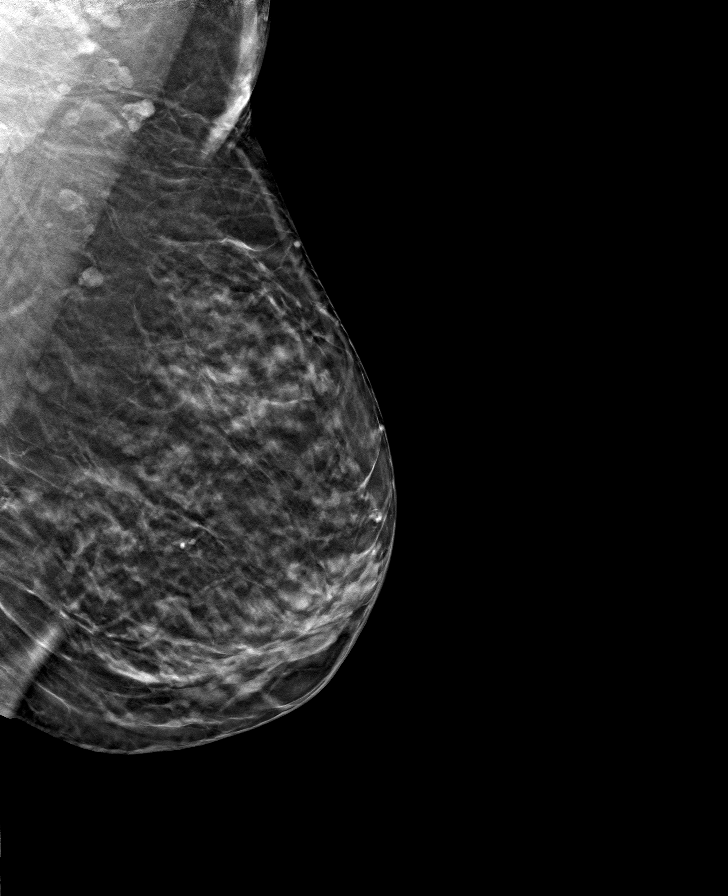

[8 of 24 positions shown; findings below may reference images not displayed]

ACR Breast Density Category c: The breast tissue is heterogeneously
dense, which may obscure small masses.
FINDINGS: In the right breast, possible distortion warrants further
evaluation. In the left breast, no findings suspicious for
malignancy.
IMPRESSION: Further evaluation is suggested for possible distortion in the right
breast.

RECOMMENDATION:
Diagnostic mammogram and possibly ultrasound of the right breast.
(Code:56-2-XX1)

The patient will be contacted regarding the findings, and additional
imaging will be scheduled.

BI-RADS CATEGORY  0: Incomplete. Need additional imaging evaluation
and/or prior mammograms for comparison.

## 2022-03-17 ENCOUNTER — Encounter (HOSPITAL_COMMUNITY): Payer: Self-pay

## 2023-03-22 ENCOUNTER — Encounter: Payer: Self-pay | Admitting: Family Medicine

## 2023-03-22 ENCOUNTER — Other Ambulatory Visit: Payer: Self-pay | Admitting: Family Medicine

## 2023-03-22 DIAGNOSIS — N951 Menopausal and female climacteric states: Secondary | ICD-10-CM

## 2023-03-22 DIAGNOSIS — R5382 Chronic fatigue, unspecified: Secondary | ICD-10-CM

## 2023-05-06 ENCOUNTER — Ambulatory Visit
Admission: RE | Admit: 2023-05-06 | Discharge: 2023-05-06 | Disposition: A | Discharge status: Home / Self Care | Payer: Self-pay | Source: Ambulatory Visit | Attending: Family Medicine | Admitting: Family Medicine

## 2023-05-06 DIAGNOSIS — N951 Menopausal and female climacteric states: Secondary | ICD-10-CM

## 2023-05-06 DIAGNOSIS — R5382 Chronic fatigue, unspecified: Secondary | ICD-10-CM

## 2024-07-12 ENCOUNTER — Encounter: Payer: Self-pay | Admitting: Gastroenterology

## 2024-08-28 ENCOUNTER — Ambulatory Visit (INDEPENDENT_AMBULATORY_CARE_PROVIDER_SITE_OTHER): Admitting: Gastroenterology

## 2024-08-28 ENCOUNTER — Encounter: Payer: Self-pay | Admitting: Gastroenterology

## 2024-08-28 VITALS — BP 134/80 | HR 65 | Ht 66.0 in | Wt 184.0 lb

## 2024-08-28 DIAGNOSIS — R194 Change in bowel habit: Secondary | ICD-10-CM

## 2024-08-28 DIAGNOSIS — R195 Other fecal abnormalities: Secondary | ICD-10-CM | POA: Diagnosis not present

## 2024-08-28 DIAGNOSIS — R197 Diarrhea, unspecified: Secondary | ICD-10-CM

## 2024-08-28 DIAGNOSIS — R140 Abdominal distension (gaseous): Secondary | ICD-10-CM

## 2024-08-28 NOTE — Patient Instructions (Addendum)
 Recommend low fodmap diet   You have been given a testing kit to check for small intestine bacterial overgrowth (SIBO) which is completed by a company named [facility]. Make sure to return your test in the mail using the return mailing label given to you along with the kit. The test order, your demographic and insurance information have all already been sent to the company. [facility] will collect an upfront charge of $109.00 for commercial insurance plans and $229.00 if you are paying cash. The potential remaining total after claim submission and review is $120.00. Make sure to discuss with [facility] PRIOR to having the test to see if they have gotten information from your insurance company as to how much your testing will cost out of pocket, if any. Please contact [facility] at phone number [phone] to get instructions regarding how to perform the test as our office is unable to give specific testing instructions.   _______________________________________________________  If your blood pressure at your visit was 140/90 or greater, please contact your primary care physician to follow up on this.  _______________________________________________________  If you are age 72 or older, your body mass index should be between 23-30. Your Body mass index is 29.7 kg/m. If this is out of the aforementioned range listed, please consider follow up with your Primary Care Provider.  If you are age 48 or younger, your body mass index should be between 19-25. Your Body mass index is 29.7 kg/m. If this is out of the aformentioned range listed, please consider follow up with your Primary Care Provider.   ________________________________________________________  The [facility] providers would like to encourage you to use MYCHART to communicate with providers for non-urgent requests or questions.  Due to long hold times on the telephone, sending your provider a message by [facility] may be a  faster and more efficient way to get a response.  Please allow 48 business hours for a response.  Please remember that this is for non-urgent requests.  _______________________________________________________  [facility] is using a team-based approach to care.  Your team is made up of your doctor and two to three APPS. Our APPS (Nurse Practitioners and Physician Assistants) work with your physician to ensure care continuity for you. They are fully qualified to address your health concerns and develop a treatment plan. They communicate directly with your gastroenterologist to care for you. Seeing the Advanced Practice Practitioners on your physician's team can help you by facilitating care more promptly, often allowing for earlier appointments, access to diagnostic testing, procedures, and other specialty referrals.   Thank you for trusting me with your gastrointestinal care. Deanna May, FNP-C

## 2024-08-28 NOTE — Progress Notes (Signed)
 Chief Complaint: evaluation for Sibo Primary GI Doctor: Dr. Suzann  HPI:  Patient is a  63  year old female patient with past medical history of hypothyroidism, who was self referred to me for a evaluation of small intestinal bacterial overgrowth .    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 She reports when she made the appointment she was having loose bowels first thing in the morning, then after lunch she experienced loose stools with urgency, then normal BM's in the evening.  She states her stools three months ago smelled swampy. She reports her symptoms have improved some since she made the appointment.   She notes history of intermittent loose stools and bouts of nausea and vomiting as a child. She has been following a low fodmap diet which has helped along with gut repair supplements. No blood in stool. No nocturnal symptoms.  She notes minimal bloating or tightness in abdomen.  Appetite good. Denies nausea or vomiting.  Nonsmoker. One drink per day.   Patients last colonoscopy approximately 7 years ago that was normal. She has had a total of 4 colonoscopies over the course of 5 years. She has had a few colon polyps removed on 1-2 colonoscopies.  Per patient did Cologuard last year and was negative. She reports she has had ileoscopy that was also normal. Never had EGD.  Surgical history:lumpectomy and urethral mesh sling.   Patient's family history includes cervical CA in her mother , no known family history of celiac disease or IBD, no colon CA, no esophageal CA  Wt Readings from Last 3 Encounters:  08/28/24 184 lb (83.5 kg)  05/08/21 173 lb 8 oz (78.7 kg)    Past Medical History:  Diagnosis Date   Hashimoto's disease    Hypothyroidism     Past Surgical History:  Procedure Laterality Date   BREAST LUMPECTOMY WITH RADIOACTIVE SEED LOCALIZATION Right  05/08/2021   Procedure: RIGHT BREAST LUMPECTOMY WITH RADIOACTIVE SEED LOCALIZATION;  Surgeon: Belinda Cough, MD;  Location: [facility];  Service: General;  Laterality: Right;    Current Outpatient Medications  Medication Sig Dispense Refill   cholecalciferol (VITAMIN D3) 25 MCG (1000 UNIT) tablet Take 1,000 Units by mouth daily.     Levothyroxine Sodium 88 MCG CAPS Take by mouth daily before breakfast.     liothyronine (CYTOMEL) 25 MCG tablet 1 tablet on an empty stomach Orally Once a day     Omega-3 Fatty Acids (FISH OIL) 1000 MG CAPS Take by mouth.     Probiotic Product (PROBIOTIC-10 PO) Take by mouth.     B Complex-C (B-COMPLEX WITH VITAMIN C) tablet Take 1 tablet by mouth daily.     No current facility-administered medications for this visit.    Allergies as of 08/28/2024 - Review Complete 08/28/2024  Allergen Reaction Noted   Codeine  04/28/2021   Gluten meal  05/08/2021   Hydrocodone  04/28/2021   Lactose intolerance (gi)  05/08/2021    History reviewed. No pertinent family history.  Review of Systems:    Constitutional: No weight loss, fever, chills, weakness or fatigue HEENT: Eyes: No change in vision               Ears, Nose, Throat:  No change in hearing or congestion Skin: No rash or itching Cardiovascular: No chest pain, chest pressure or palpitations   Respiratory: No SOB or cough Gastrointestinal: See HPI and otherwise negative Genitourinary:  No dysuria or change in urinary frequency Neurological: No headache, dizziness or syncope Musculoskeletal: No new muscle or joint pain Hematologic: No bleeding or bruising Psychiatric: No history of depression or anxiety    Physical Exam:  Vital signs: BP 134/80   Pulse 65   Ht 5' 6 (1.676 m)   Wt 184 lb (83.5 kg)   BMI 29.70 kg/m   Constitutional:   Pleasant  female appears to be in NAD, Well developed, Well nourished, alert and cooperative Throat: Oral cavity and pharynx without inflammation,  swelling or lesion.  Respiratory: Respirations even and unlabored. Lungs clear to auscultation bilaterally.   No wheezes, crackles, or rhonchi.  Cardiovascular: Normal S1, S2. Regular rate and rhythm. No peripheral edema, cyanosis or pallor.  Gastrointestinal:  Soft, nondistended, nontender. No rebound or guarding. Normal bowel sounds. No appreciable masses or hepatomegaly. Rectal:  Not performed.  Msk:  Symmetrical without gross deformities. Without edema, no deformity or joint abnormality.  Neurologic:  Alert and  oriented x4;  grossly normal neurologically.  Skin:   Dry and intact without significant lesions or rashes.  RELEVANT LABS AND IMAGING: Per patient just had labs with PCP and normal  Assessment: Encounter Diagnoses  Name Primary?   Altered bowel habits Yes   Bloating      63 year old female patient that presents for evaluation of small bacteria overgrowth.  Patient reports since she was young she has had issues with intermittent loose stools. She recently had issues with odorous stools and wanted to r/o Sibo.  Patient has had multiple colonoscopies at outside facility that per patient were normal.  Patient has never had upper GI endoscopy.  Per patient last Cologuard was negative in 2024.  We discussed celiac testing however patient declined.  She has seen improvement with low FODMAP diet which she will continue.  Will go ahead and order SIBO testing.  Will request any lab work or procedures from PCP for records.  Plan: - Order Sibo test -offered celiac testing, declined -continue low fodmap diet -request lab work and tests from PCP   Thank you for the courtesy of this consult. Please call me with any questions or concerns.   Chelsei Mcchesney, FNP-C [facility] 08/28/2024, 9:55 AM  Cc: Gail Bergeron, MD  I have reviewed the clinic note as outlined by Cathryne Beal, NP and agree with the assessment, plan and medical decision making.  Ms. Grupe presents to the office  with a specific concern regarding possible SIBO.  Endorses bloating and malodorous stools since childhood.  She has been working with a functional medicine doctor who broached the possibility of SIBO.  She has been compliant with colorectal cancer screening and also had a negative Cologuard.  Agree with proceeding with breath testing for SIBO.  Noted that patient declines celiac testing.  Will continue low FODMAP diet.  Inocente Hausen, MD

## 2024-09-12 ENCOUNTER — Telehealth: Payer: Self-pay | Admitting: Gastroenterology

## 2024-09-12 DIAGNOSIS — K580 Irritable bowel syndrome with diarrhea: Secondary | ICD-10-CM

## 2024-09-12 NOTE — Telephone Encounter (Signed)
 SIBO breath test results Date of service 09/01/2024 Increase in hydrogen, patient result 56 (high) Increase in methane, patient result 11 (normal) Increase in combined hydrogen and methane, patient results 67 (high) Analysis of the data suggests bacterial overgrowth is suspected

## 2024-09-13 MED ORDER — RIFAXIMIN 550 MG PO TABS: 550.0000 mg | ORAL_TABLET | Freq: Three times a day (TID) | ORAL | 0 refills | Status: AC

## 2024-09-14 ENCOUNTER — Other Ambulatory Visit (HOSPITAL_COMMUNITY): Payer: Self-pay

## 2024-09-14 ENCOUNTER — Telehealth: Payer: Self-pay | Admitting: Gastroenterology

## 2024-09-14 ENCOUNTER — Telehealth: Payer: Self-pay

## 2024-09-14 NOTE — Telephone Encounter (Signed)
 Pharmacy Patient Advocate Encounter   Received notification from Pt Calls Messages that prior authorization for Xifaxan 550MG  tablets is required/requested.   Insurance verification completed.   The patient is insured through [employer].   Per test claim: PA required; PA submitted to above mentioned insurance via Latent Key/confirmation #/EOC [id number] Status is pending

## 2024-09-14 NOTE — Telephone Encounter (Signed)
 [facility] pharmacy fax over a request for this patient stating that she is needing to have a prior authorization for her medication Xifaxan 550 MG tablets. Please advise.

## 2024-09-14 NOTE — Telephone Encounter (Signed)
 Pharmacy Patient Advocate Encounter  Received notification from [employer] that Prior Authorization for Xifaxan 550MG  tablets has been DENIED.  Full denial letter will be uploaded to the media tab. See denial reason below.  Criteria for coverage requires an FDA Museum/gallery exhibitions officer and Drug Administration) approved indication for the use of Xifaxan. The treatment of Small Intestinal Bacterial Overgrowth (SIBO)/Small Bowel Bacterial Overgrowth (SBBO) (excessive bacteria in the small intestine) is not an FDA Museum/gallery exhibitions officer and Drug Administration) approved indication for Xifaxan and is excluded from coverage.  PA #/Case ID/Reference #: [id number]

## 2024-09-15 NOTE — Telephone Encounter (Signed)
 Left message for patient to call back

## 2024-09-21 NOTE — Telephone Encounter (Signed)
 Left message for patient to call back

## 2024-09-21 NOTE — Telephone Encounter (Signed)
 Spoke with patient & discussed xifaxan denial and other alternatives. At this time she does not want to pursue any SIBO treatment.
